# Patient Record
Sex: Male | Born: 1980 | Race: Black or African American | Hispanic: No | Marital: Single | Smoking: Current every day smoker
Health system: Southern US, Community
[De-identification: ages and names within clinical notes are randomized; demographics above are authoritative.]

---
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
## 2012-10-04 ENCOUNTER — Emergency Department (HOSPITAL_COMMUNITY)
Admission: EM | Admit: 2012-10-04 | Discharge: 2012-10-04 | Disposition: A | Discharge status: Home / Self Care | Payer: Medicaid Other | Attending: Emergency Medicine | Admitting: Emergency Medicine

## 2012-10-04 ENCOUNTER — Emergency Department (HOSPITAL_COMMUNITY): Payer: Medicaid Other

## 2012-10-04 ENCOUNTER — Encounter (HOSPITAL_COMMUNITY): Payer: Self-pay | Admitting: Family Medicine

## 2012-10-04 DIAGNOSIS — R51 Headache: Secondary | ICD-10-CM

## 2012-10-04 DIAGNOSIS — H53149 Visual discomfort, unspecified: Secondary | ICD-10-CM | POA: Insufficient documentation

## 2012-10-04 DIAGNOSIS — F17200 Nicotine dependence, unspecified, uncomplicated: Secondary | ICD-10-CM | POA: Insufficient documentation

## 2012-10-04 MED ORDER — PROMETHAZINE HCL 25 MG PO TABS: 25.0000 mg | ORAL_TABLET | Freq: Four times a day (QID) | ORAL | Status: AC | PRN

## 2012-10-04 NOTE — ED Provider Notes (Signed)
History    This chart was scribed for [employer]. Rubin Payor, MD, MD by Smitty Pluck, ED Scribe. The patient was seen in room TR10C/TR10C and the patient's care was started at 1:56PM.   CSN: [id number]  Arrival date & time 10/04/12  1240      Chief Complaint  Patient presents with  . Headache    (Consider location/radiation/quality/duration/timing/severity/associated sxs/prior treatment) The history is provided by the patient. No language interpreter was used.   Pearlie Lafosse is a 31 y.o. male who presents to the Emergency Department complaining of intermittent, moderate headache onset 1 week ago. Pt reports that headache starts at eyes and radiates to frontal lobe. Episodes of last 1 hour then subsided. Pt denies hx of headaches. He states he has photophobia and mild watery eyes. Pt denies fevers, visual disturbance, nausea, vomiting and any other pain. Pt reports that he smokes cigars.   History reviewed. No pertinent past medical history.  History reviewed. No pertinent past surgical history.  History reviewed. No pertinent family history.  History  Substance Use Topics  . Smoking status: Current Every Day Smoker  . Smokeless tobacco: Not on file  . Alcohol Use: No      Review of Systems  Constitutional: Negative for fever and chills.  Eyes: Positive for photophobia. Negative for visual disturbance.  Respiratory: Negative for shortness of breath.   Gastrointestinal: Negative for nausea and vomiting.  Neurological: Positive for headaches. Negative for weakness.  All other systems reviewed and are negative.    Allergies  Review of patient's allergies indicates no known allergies.  Home Medications   Current Outpatient Rx  Name  Route  Sig  Dispense  Refill  . IBUPROFEN 200 MG PO TABS   Oral   Take 400 mg by mouth every 6 (six) hours as needed. For pain         . PROMETHAZINE HCL 25 MG PO TABS   Oral   Take 1 tablet (25 mg total) by mouth every 6 (six) hours  as needed (headache).   10 tablet   0     BP 133/78  Temp 97.7 F (36.5 C) (Oral)  Resp 20  Ht 6\' 3"  (1.905 m)  Wt 175 lb (79.379 kg)  BMI 21.87 kg/m2  SpO2 97%  Physical Exam  Nursing note and vitals reviewed. Constitutional: He is oriented to person, place, and time. He appears well-developed and well-nourished. No distress.  HENT:  Head: Normocephalic and atraumatic.  Eyes: Conjunctivae normal and EOM are normal.  Neck: Neck supple. No tracheal deviation present.  Cardiovascular: Normal rate, regular rhythm and normal heart sounds.   Pulmonary/Chest: Effort normal and breath sounds normal. No respiratory distress. He has no wheezes.  Musculoskeletal: Normal range of motion.  Neurological: He is alert and oriented to person, place, and time.  Skin: Skin is warm and dry.  Psychiatric: He has a normal mood and affect. His behavior is normal.    ED Course  Procedures (including critical care time)   COORDINATION OF CARE: 2:00 PM Discussed ED treatment with pt     Labs Reviewed - No data to display Ct Head Wo Contrast  10/04/2012  *RADIOLOGY REPORT*  Clinical Data: Severe headache for 1 week.  No prior trauma.  CT HEAD WITHOUT CONTRAST  Technique:  Contiguous axial images were obtained from the base of the skull through the vertex without contrast.  Comparison: None.  Findings: There is no evidence for hemorrhage, mass lesion, or acute infarction.  The ventricles and basilar cisterns have a normal appearance.  Bone windows are unremarkable.  There is soft tissue thickening in the posterior nasopharynx, consistent with adenoidal hypertrophy.  IMPRESSION:  1. No evidence for acute intracranial abnormality. 2. Tissue thickening in the posterior nasopharynx.  Most likely, this represents reactive lymphoid tissue.  Less likely, this could also be associated with lymphoma.   Original Report Authenticated By: Norva Pavlov, M.D.      1. Headache       MDM  Patient with  headaches. Unilateral right-sided. CT shows only some swollen adenoids. Will need to be followed. Family and patient were informed of this. Patient be discharged home with Phenergan to take as needed      I personally performed the services described in this documentation, which was scribed in my presence. The recorded information has been reviewed and is accurate.     Juliet Rude. Rubin Payor, MD 10/04/12 [phone]

## 2012-10-04 NOTE — ED Notes (Signed)
Per pt has been having intermittent HA x 1 weeks and has been taking ibuprofen with relief. sts no hx of HA

## 2013-12-12 IMAGING — CT CT HEAD W/O CM
1 of 2 series · 13 of 30 positions shown, 17 images · non-contrast
Comparison: None.

CLINICAL DATA: Severe headache for 1 week.  No prior trauma.

CT HEAD WITHOUT CONTRAST
TECHNIQUE: Contiguous axial images were obtained from the base of
the skull through the vertex without contrast.

[Series 3: brain · axial · 0.49mm/px · z∈[+166,+296]mm · 13 of 32 slices shown, 17 images]
[im 3/32  brain]
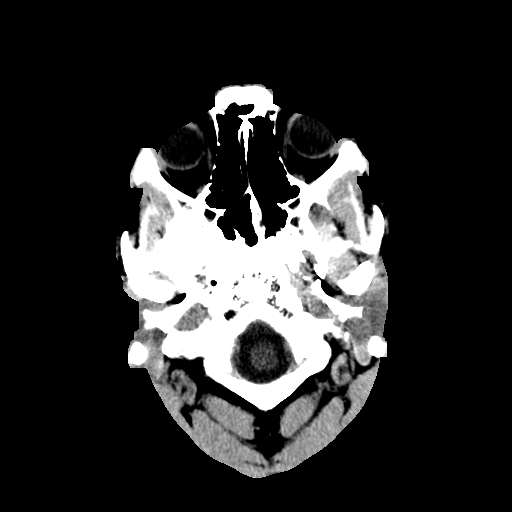
[im 3/32  bone]
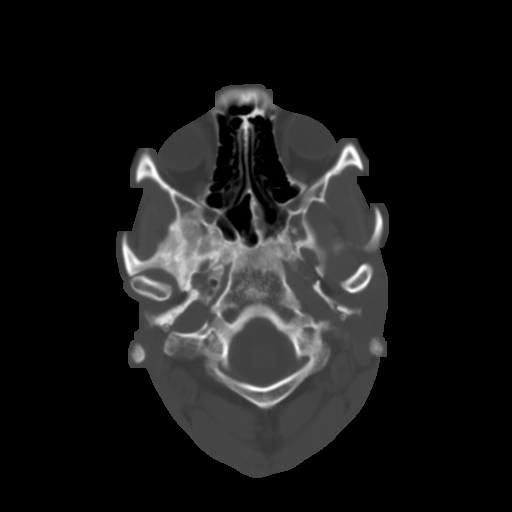
[im 5/32  brain]
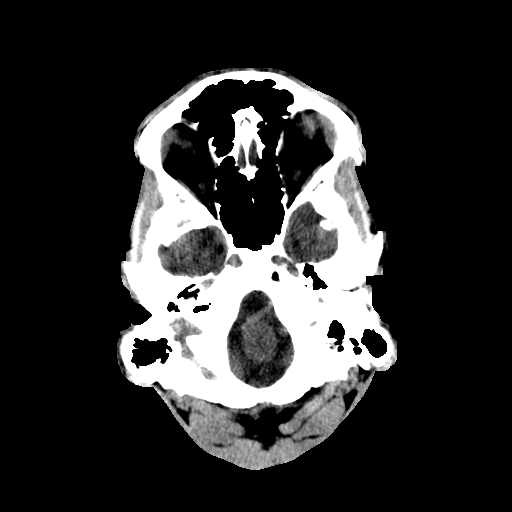
[im 7/32  brain]
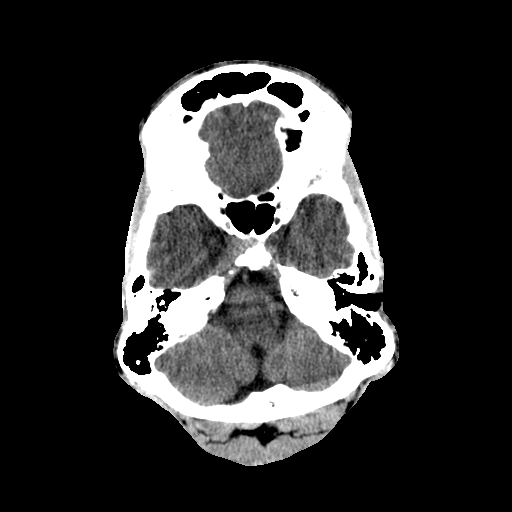
[im 9/32  brain]
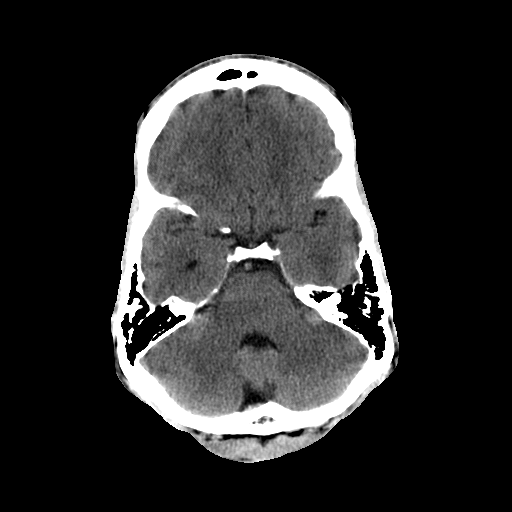
[im 12/32  brain]
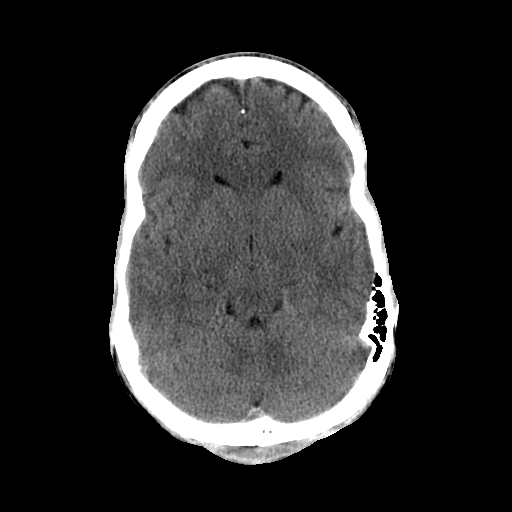
[im 12/32  bone]
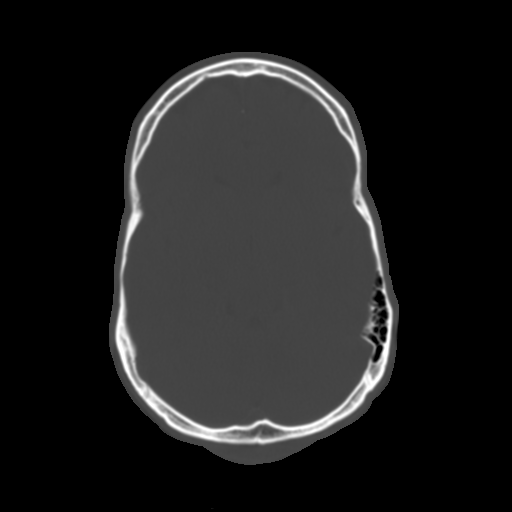
[im 14/32  brain]
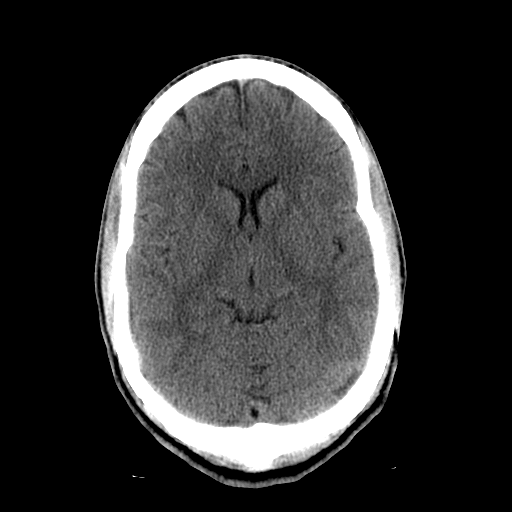
[im 16/32  brain]
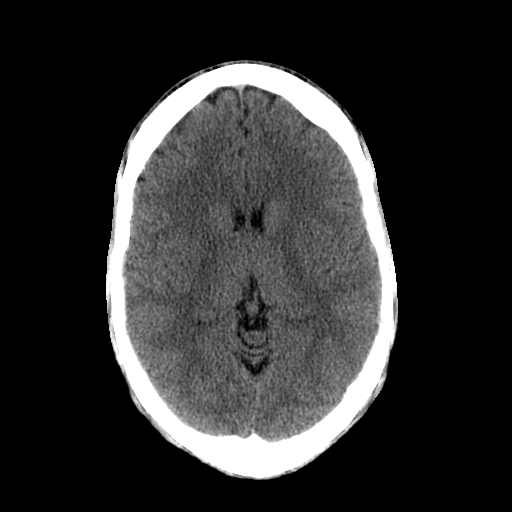
[im 18/32  brain]
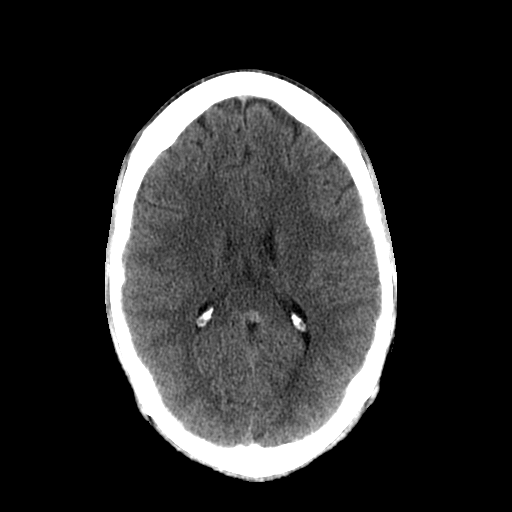
[im 20/32  brain]
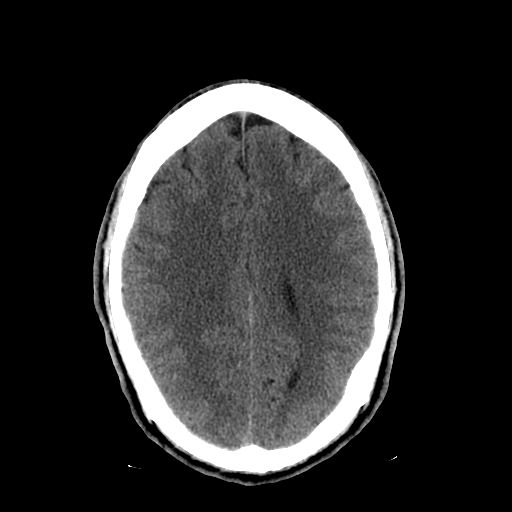
[im 20/32  bone]
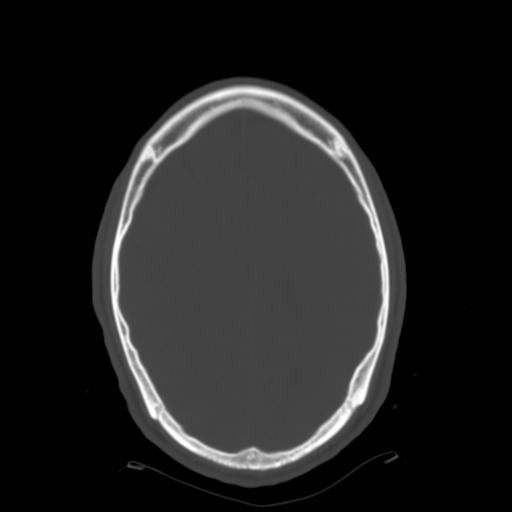
[im 23/32  brain]
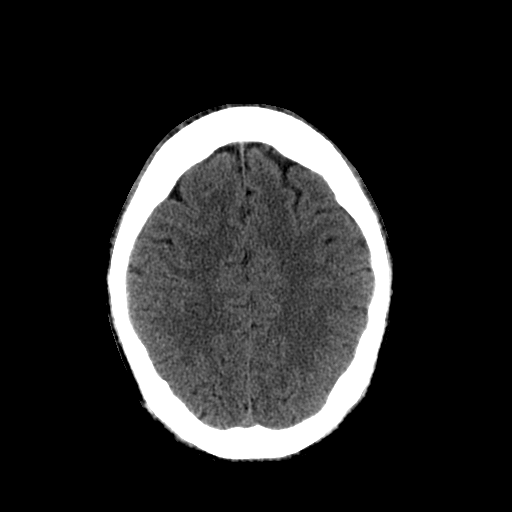
[im 25/32  brain]
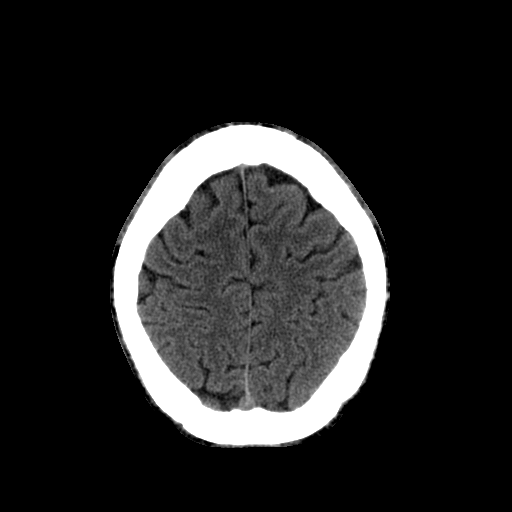
[im 27/32  brain]
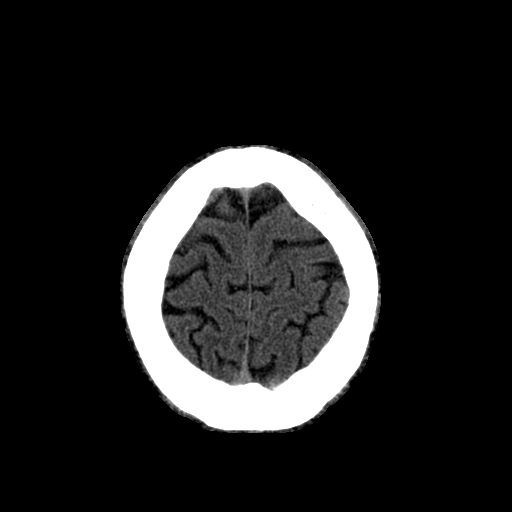
[im 29/32  brain]
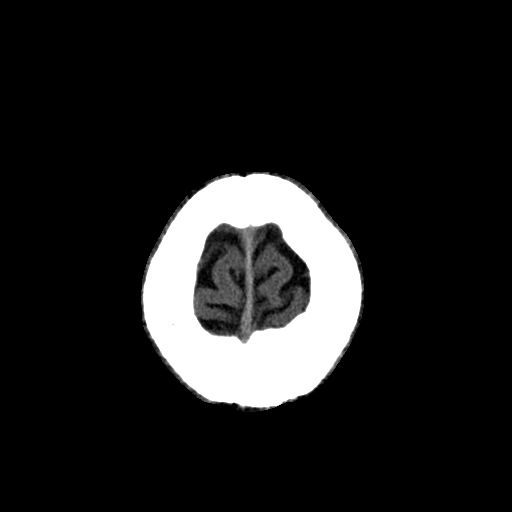
[im 29/32  bone]
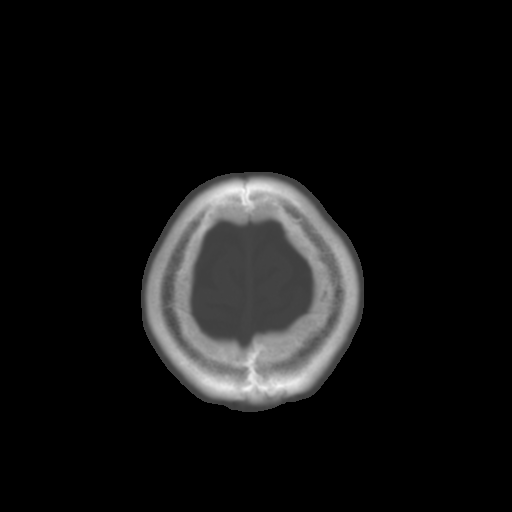

[13 of 30 positions shown; findings below may reference images not displayed]

FINDINGS: There is no evidence for hemorrhage, mass lesion, or
acute infarction.  The ventricles and basilar cisterns have a
normal appearance.  Bone windows are unremarkable.

There is soft tissue thickening in the posterior nasopharynx,
consistent with adenoidal hypertrophy.
IMPRESSION: 1. No evidence for acute intracranial abnormality.
2. Tissue thickening in the posterior nasopharynx.  Most likely,
this represents reactive lymphoid tissue.  Less likely, this could
also be associated with lymphoma.

## 2018-09-10 ENCOUNTER — Emergency Department (HOSPITAL_COMMUNITY)
Admission: EM | Admit: 2018-09-10 | Discharge: 2018-09-10 | Disposition: A | Discharge status: Home / Self Care | Payer: Self-pay | Attending: Emergency Medicine | Admitting: Emergency Medicine

## 2018-09-10 ENCOUNTER — Other Ambulatory Visit: Payer: Self-pay

## 2018-09-10 ENCOUNTER — Encounter (HOSPITAL_COMMUNITY): Payer: Self-pay | Admitting: Emergency Medicine

## 2018-09-10 DIAGNOSIS — R519 Headache, unspecified: Secondary | ICD-10-CM

## 2018-09-10 DIAGNOSIS — R51 Headache: Secondary | ICD-10-CM | POA: Insufficient documentation

## 2018-09-10 DIAGNOSIS — F17200 Nicotine dependence, unspecified, uncomplicated: Secondary | ICD-10-CM | POA: Insufficient documentation

## 2018-09-10 MED ORDER — DIPHENHYDRAMINE HCL 50 MG/ML IJ SOLN
25.0000 mg | Freq: Once | INTRAMUSCULAR | Status: AC
  Administered 2018-09-10: 25 mg via INTRAMUSCULAR
  Filled 2018-09-10: qty 1

## 2018-09-10 MED ORDER — KETOROLAC TROMETHAMINE 15 MG/ML IJ SOLN
30.0000 mg | Freq: Once | INTRAMUSCULAR | Status: AC
  Administered 2018-09-10: 30 mg via INTRAMUSCULAR
  Filled 2018-09-10: qty 2

## 2018-09-10 NOTE — ED Provider Notes (Signed)
[facility]-EMERGENCY DEPT Provider Note   CSN: [id number] Arrival date & time: 09/10/18  1035     History   Chief Complaint Chief Complaint  Patient presents with  . Headache    HPI Isaiah Evans is a 37 y.o. male.  37 year old male with prior medical history as detailed below presents for evaluation of headache.  Patient reports intermittent headache for the last month.  He denies associated fever, visual change, nausea, vomiting, neck pain, or other acute complaint.  He does report prior episodes of headache that are similar to today's headache.  He reports that stress at home will cause him to have such a headache.  He is taken some Excedrin earlier this week with improvement in his symptoms.  The history is provided by the patient and medical records.  Headache   This is a recurrent problem. The current episode started more than 1 week ago. The problem occurs every few hours. The problem has not changed since onset.The headache is associated with nothing. The pain is located in the right unilateral and frontal region. The quality of the pain is described as dull. The pain is mild. The pain does not radiate. Pertinent negatives include no fever, no chest pressure, no palpitations, no syncope, no nausea and no vomiting. He has tried NSAIDs for the symptoms.    History reviewed. No pertinent past medical history.  There are no active problems to display for this patient.   History reviewed. No pertinent surgical history.      Home Medications    Prior to Admission medications   Medication Sig Start Date End Date Taking? Authorizing Provider  ibuprofen (ADVIL,MOTRIN) 200 MG tablet Take 400 mg by mouth every 6 (six) hours as needed. For pain    [provider]  promethazine (PHENERGAN) 25 MG tablet Take 1 tablet (25 mg total) by mouth every 6 (six) hours as needed (headache). 10/04/12   Benjiman Core, MD    Family History History reviewed.  No pertinent family history.  Social History Social History   Tobacco Use  . Smoking status: Current Every Day Smoker    Types: Cigars  . Smokeless tobacco: Never Used  Substance Use Topics  . Alcohol use: No  . Drug use: Yes    Types: Marijuana     Allergies   Patient has no known allergies.   Review of Systems Review of Systems  Constitutional: Negative for fever.  Cardiovascular: Negative for palpitations and syncope.  Gastrointestinal: Negative for nausea and vomiting.  Neurological: Positive for headaches.  All other systems reviewed and are negative.    Physical Exam Updated Vital Signs BP (!) 157/77 (BP Location: Left Arm)   Pulse 63   Temp 97.7 F (36.5 C) (Oral)   Resp 16   Ht 6\' 3"  (1.905 m)   Wt 79.4 kg   SpO2 100%   BMI 21.87 kg/m   Physical Exam  Constitutional: He is oriented to person, place, and time. He appears well-developed and well-nourished. No distress.  HENT:  Head: Normocephalic and atraumatic.  Mouth/Throat: Oropharynx is clear and moist.  Eyes: Pupils are equal, round, and reactive to light. Conjunctivae and EOM are normal.  Neck: Normal range of motion. Neck supple.  Cardiovascular: Normal rate, regular rhythm and normal heart sounds.  Pulmonary/Chest: Effort normal and breath sounds normal. No respiratory distress.  Abdominal: Soft. He exhibits no distension. There is no tenderness.  Musculoskeletal: Normal range of motion. He exhibits no edema or deformity.  Neurological: He is alert and oriented to person, place, and time. He has normal strength. He is not disoriented. He displays normal reflexes. No cranial nerve deficit or sensory deficit. Coordination and gait normal. GCS eye subscore is 4. GCS verbal subscore is 5. GCS motor subscore is 6.  Skin: Skin is warm and dry.  Psychiatric: He has a normal mood and affect.  Nursing note and vitals reviewed.    ED Treatments / Results  Labs (all labs ordered are listed, but only  abnormal results are displayed) Labs Reviewed - No data to display  EKG None  Radiology No results found.  Procedures Procedures (including critical care time)  Medications Ordered in ED Medications  ketorolac (TORADOL) 15 MG/ML injection 30 mg (has no administration in time range)  diphenhydrAMINE (BENADRYL) injection 25 mg (has no administration in time range)     Initial Impression / Assessment and Plan / ED Course  I have reviewed the triage vital signs and the nursing notes.  Pertinent labs & imaging results that were available during my care of the patient were reviewed by me and considered in my medical decision making (see chart for details).     MDM  Screen complete  Patient presents for evaluation of headache. Headache present on a daily basis for the last month. No red flag findings on history or exam.   Patient feels improved following treatment in the ED.   Importance of close follow up stressed. Strict return precautions given and understood.   Final Clinical Impressions(s) / ED Diagnoses   Final diagnoses:  Nonintractable episodic headache, unspecified headache type    ED Discharge Orders    None       Wynetta FinesMessick, Peter C, MD 09/11/18 [phone]

## 2018-09-10 NOTE — ED Notes (Signed)
Pt is alert and oriented x 4 and is verbally responsive. PT states that he has had a HA the past few days 7/10 pain. No associated n/v, or visual changes. Pt reports that he has not taken any medication for discomfort at this time.

## 2018-09-10 NOTE — ED Provider Notes (Signed)
[facility]-EMERGENCY DEPT Provider Note   CSN: [id number] Arrival date & time: 09/10/18  1035     History   Chief Complaint Chief Complaint  Patient presents with  . Headache    HPI Isaiah Evans is a 37 y.o. male.  37 year old male with prior medical history as detailed below presents for evaluation of headache.  Patient reports persistent headache for the last month.  Patient's describes right-sided headache.  He denies associated fever, nausea, vomiting, or other complaint.  He appears to be in no acute distress during my evaluation.  He is laughing and talkative with his family and with myself during his visit in the ED hallway bed. He has not taken anything today for his symptoms.   The history is provided by the patient and medical records.  Headache   This is a recurrent problem. The current episode started more than 1 week ago. The problem occurs constantly. The problem has not changed since onset.The headache is associated with nothing. The pain is located in the right unilateral and frontal region. The quality of the pain is described as sharp and throbbing. The pain is mild. Pertinent negatives include no anorexia, no fever, no malaise/fatigue, no chest pressure, no near-syncope, no orthopnea, no palpitations, no syncope, no shortness of breath, no nausea and no vomiting.    History reviewed. No pertinent past medical history.  There are no active problems to display for this patient.   History reviewed. No pertinent surgical history.      Home Medications    Prior to Admission medications   Medication Sig Start Date End Date Taking? Authorizing Provider  aspirin-acetaminophen-caffeine (EXCEDRIN MIGRAINE) [phone] MG tablet Take 1 tablet by mouth every 6 (six) hours as needed for migraine.   Yes [provider]  promethazine (PHENERGAN) 25 MG tablet Take 1 tablet (25 mg total) by mouth every 6 (six) hours as needed  (headache). Patient not taking: Reported on 09/10/2018 10/04/12   Benjiman CorePickering, Nathan, MD    Family History History reviewed. No pertinent family history.  Social History Social History   Tobacco Use  . Smoking status: Current Every Day Smoker    Types: Cigars  . Smokeless tobacco: Never Used  Substance Use Topics  . Alcohol use: No  . Drug use: Yes    Types: Marijuana     Allergies   Patient has no known allergies.   Review of Systems Review of Systems  Constitutional: Negative for fever and malaise/fatigue.  Respiratory: Negative for shortness of breath.   Cardiovascular: Negative for palpitations, orthopnea, syncope and near-syncope.  Gastrointestinal: Negative for anorexia, nausea and vomiting.  Neurological: Positive for headaches.  All other systems reviewed and are negative.    Physical Exam Updated Vital Signs BP (!) 157/77 (BP Location: Left Arm)   Pulse 63   Temp 97.7 F (36.5 C) (Oral)   Resp 16   Ht 6\' 3"  (1.905 m)   Wt 79.4 kg   SpO2 100%   BMI 21.87 kg/m   Physical Exam  Constitutional: He is oriented to person, place, and time. He appears well-developed and well-nourished. No distress.  HENT:  Head: Normocephalic and atraumatic.  Mouth/Throat: Oropharynx is clear and moist.  Eyes: Pupils are equal, round, and reactive to light. Conjunctivae and EOM are normal.  Neck: Normal range of motion. Neck supple.  Cardiovascular: Normal rate, regular rhythm and normal heart sounds.  Pulmonary/Chest: Effort normal and breath sounds normal. No respiratory distress.  Abdominal: Soft. He  exhibits no distension. There is no tenderness.  Musculoskeletal: Normal range of motion. He exhibits no edema or deformity.  Neurological: He is alert and oriented to person, place, and time. He has normal strength. He is not disoriented. He displays normal reflexes. No cranial nerve deficit or sensory deficit. Coordination and gait normal. GCS eye subscore is 4. GCS verbal  subscore is 5. GCS motor subscore is 6.  Skin: Skin is warm and dry.  Psychiatric: He has a normal mood and affect.  Nursing note and vitals reviewed.    ED Treatments / Results  Labs (all labs ordered are listed, but only abnormal results are displayed) Labs Reviewed - No data to display  EKG None  Radiology No results found.  Procedures Procedures (including critical care time)  Medications Ordered in ED Medications  ketorolac (TORADOL) 15 MG/ML injection 30 mg (30 mg Intramuscular Given 09/10/18 1158)  diphenhydrAMINE (BENADRYL) injection 25 mg (25 mg Intramuscular Given 09/10/18 1201)     Initial Impression / Assessment and Plan / ED Course  I have reviewed the triage vital signs and the nursing notes.  Pertinent labs & imaging results that were available during my care of the patient were reviewed by me and considered in my medical decision making (see chart for details).     MDM  Screen complete  Patient is presenting for evaluation of headache. Described symptoms are most suggestive of possible tension headache vs recurrent migraine. No suggestion of more significant pathology obtained from today's history or exam. No indication for ED imaging today.  Patient feels improved following administration of Benadryl.  He now desires DC home.   Patient understands need for close follow-up.  Strict return precautions given and understood.  Final Clinical Impressions(s) / ED Diagnoses   Final diagnoses:  Nonintractable episodic headache, unspecified headache type    ED Discharge Orders    None       Wynetta Fines, MD 09/10/18 1311

## 2018-09-10 NOTE — Discharge Instructions (Addendum)
Please return for any problem.  Follow-up with a regular care provider as instructed. 

## 2018-09-10 NOTE — ED Triage Notes (Signed)
Patient c/o headache for about a month. Pt states he takes Excedrin which helps briefly but it always comes back. Pt states it has woken him up from his sleep and he is sensitive to light. Pt states pain is localized to the right side of his head.
